# Patient Record
Sex: Male | Born: 1975 | Race: Black or African American | Hispanic: No | Marital: Married
Health system: Southern US, Community
[De-identification: ages and names within clinical notes are randomized; demographics above are authoritative.]

---

## 2004-05-22 ENCOUNTER — Encounter: Admission: RE | Admit: 2004-05-22 | Discharge: 2004-05-22 | Payer: Self-pay | Admitting: Family Medicine

## 2005-12-07 IMAGING — CR DG ANKLE COMPLETE 3+V*L*
3 series · 3 of 3 positions shown · non-contrast
Comparison: none

CLINICAL DATA: 28 year old with basketball injury.  Twisted ankle.
 LEFT ANKLE COMPLETE:
 The ankle mortise is maintained.  There is lateral soft tissue swelling, but no fracture is seen.

[view not recorded (1 of 3)]
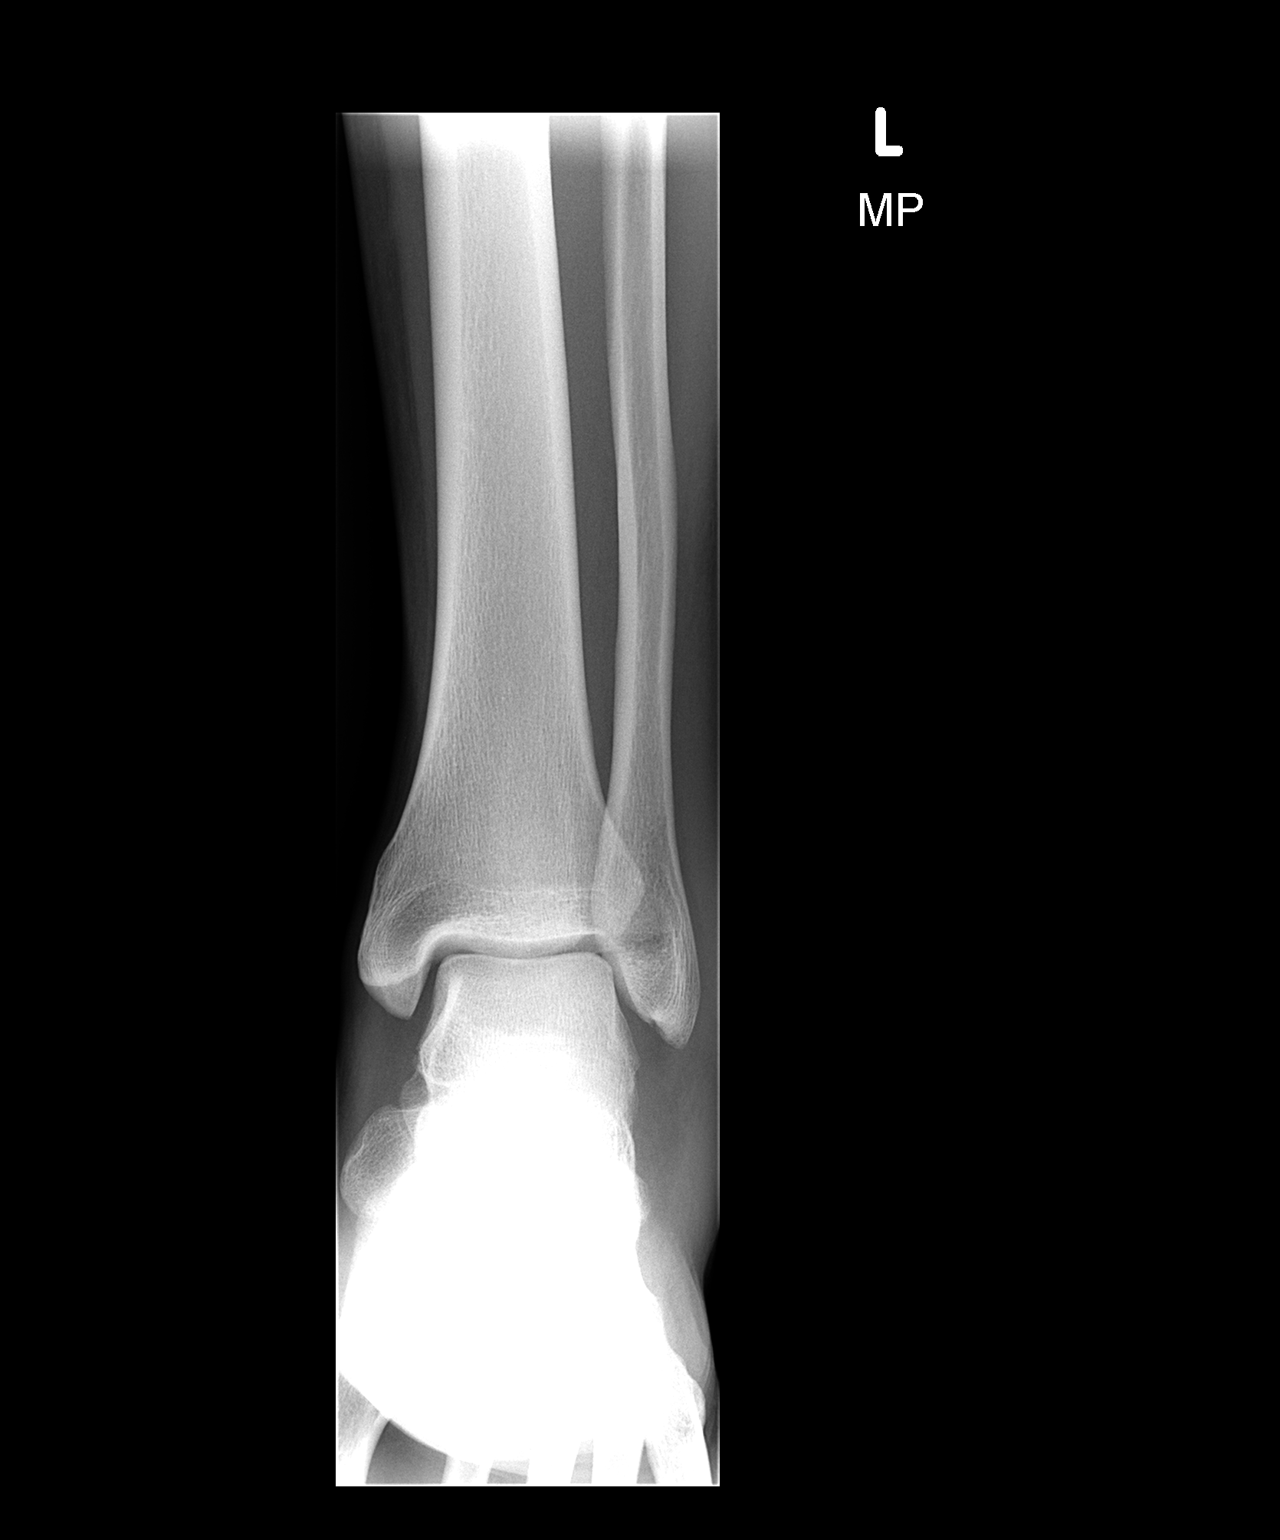

[view not recorded (2 of 3)]
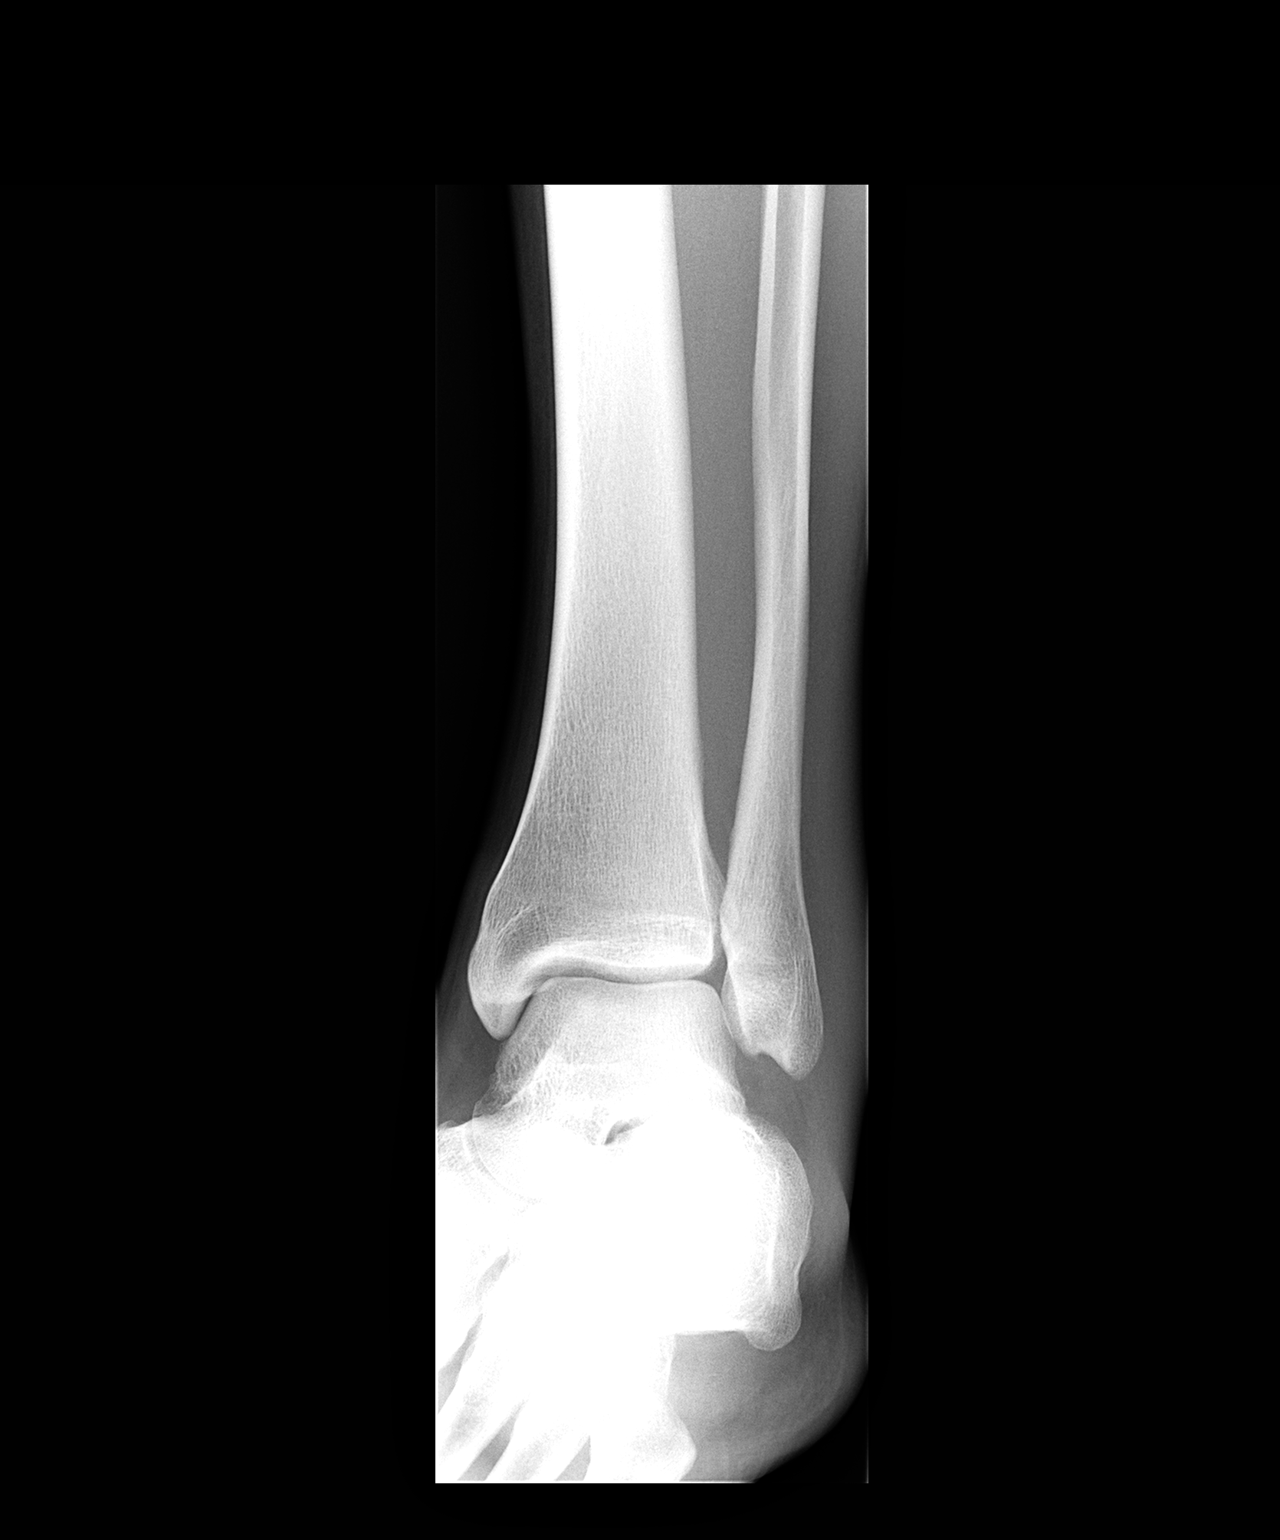

[view not recorded (3 of 3)]
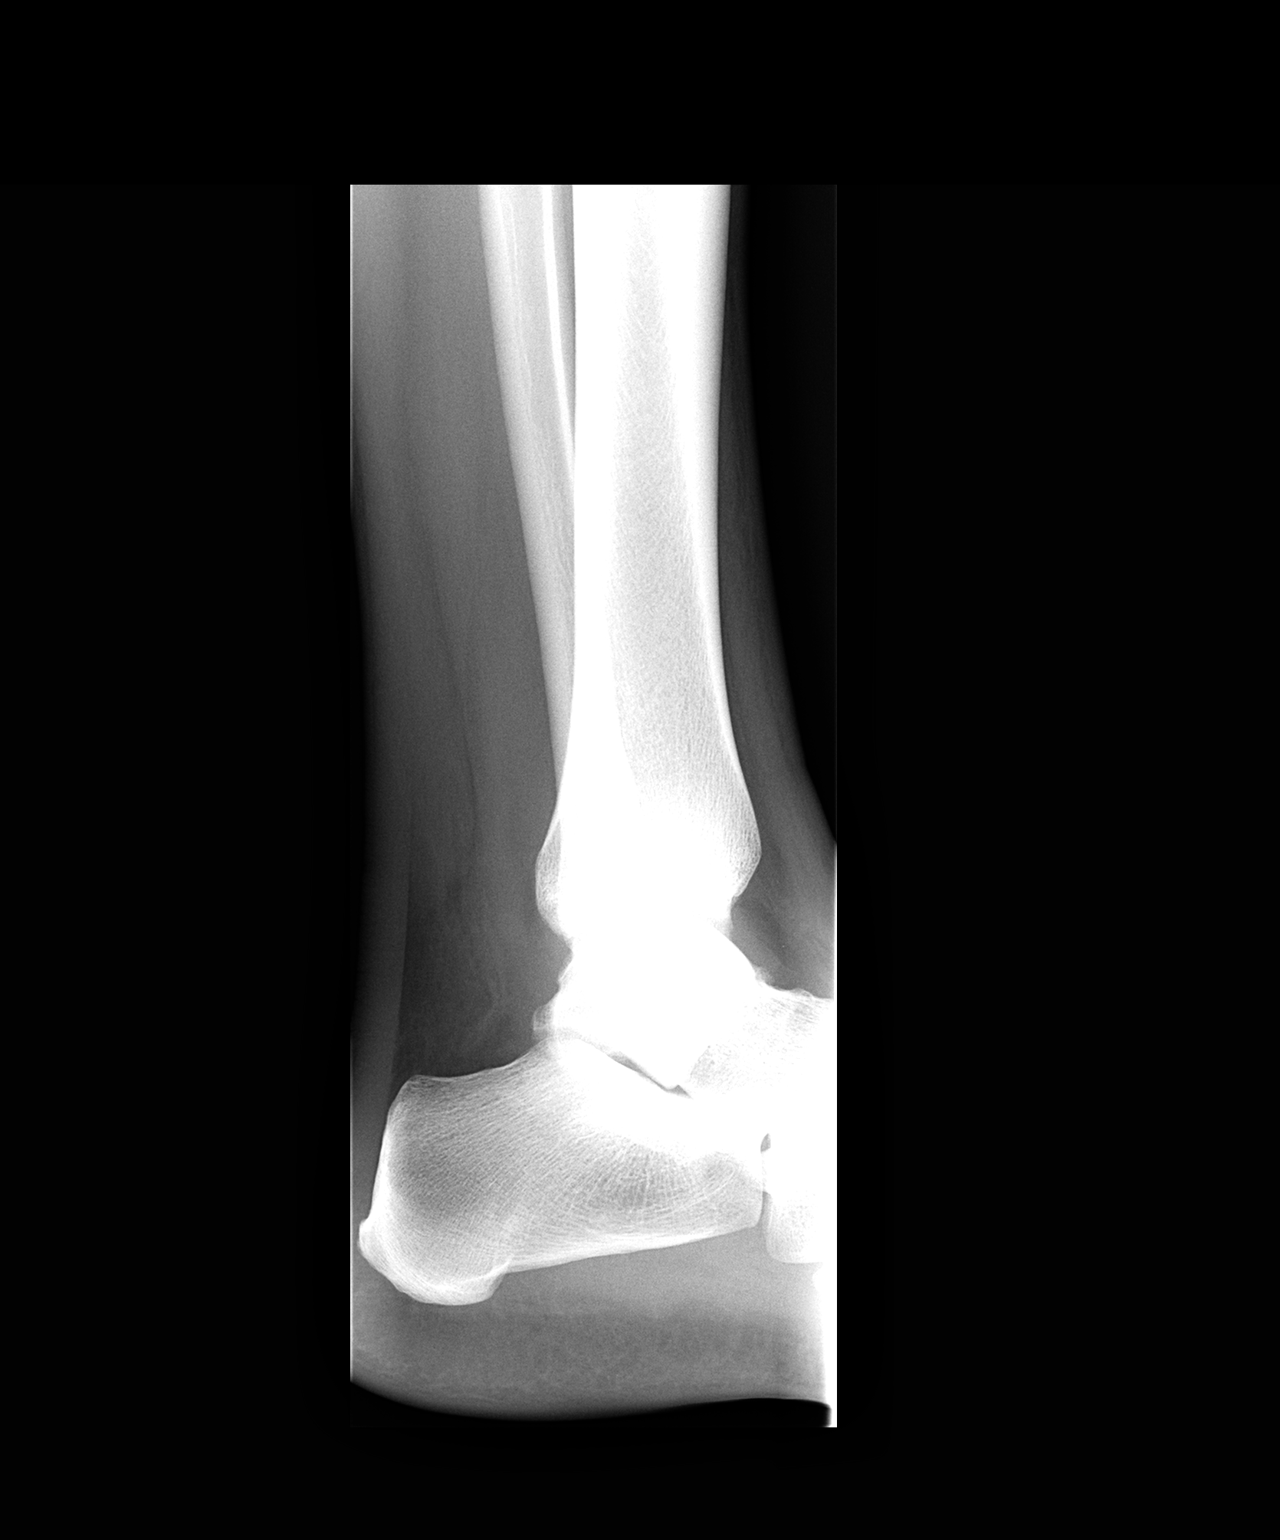

[3 of 3 positions shown; findings below may reference images not displayed]

IMPRESSION: Lateral soft tissue swelling, but no definite fracture.

## 2016-10-11 DIAGNOSIS — N400 Enlarged prostate without lower urinary tract symptoms: Secondary | ICD-10-CM | POA: Diagnosis not present

## 2016-11-23 DIAGNOSIS — R1031 Right lower quadrant pain: Secondary | ICD-10-CM | POA: Diagnosis not present

## 2016-11-30 DIAGNOSIS — Z0000 Encounter for general adult medical examination without abnormal findings: Secondary | ICD-10-CM | POA: Diagnosis not present

## 2016-11-30 DIAGNOSIS — Z13220 Encounter for screening for lipoid disorders: Secondary | ICD-10-CM | POA: Diagnosis not present

## 2017-07-30 ENCOUNTER — Other Ambulatory Visit: Payer: Self-pay | Admitting: Family Medicine

## 2017-07-30 DIAGNOSIS — N644 Mastodynia: Secondary | ICD-10-CM

## 2017-08-02 ENCOUNTER — Ambulatory Visit
Admission: RE | Admit: 2017-08-02 | Discharge: 2017-08-02 | Disposition: A | Discharge status: Home / Self Care | Payer: 59 | Source: Ambulatory Visit | Attending: Family Medicine | Admitting: Family Medicine

## 2017-08-02 ENCOUNTER — Ambulatory Visit: Payer: Self-pay

## 2017-08-02 DIAGNOSIS — R928 Other abnormal and inconclusive findings on diagnostic imaging of breast: Secondary | ICD-10-CM | POA: Diagnosis not present

## 2017-08-02 DIAGNOSIS — N644 Mastodynia: Secondary | ICD-10-CM

## 2018-01-15 DIAGNOSIS — Z13220 Encounter for screening for lipoid disorders: Secondary | ICD-10-CM | POA: Diagnosis not present

## 2018-01-15 DIAGNOSIS — Z0000 Encounter for general adult medical examination without abnormal findings: Secondary | ICD-10-CM | POA: Diagnosis not present

## 2019-06-03 ENCOUNTER — Ambulatory Visit: Payer: 59 | Attending: Internal Medicine

## 2019-06-03 DIAGNOSIS — Z20822 Contact with and (suspected) exposure to covid-19: Secondary | ICD-10-CM

## 2019-06-04 ENCOUNTER — Ambulatory Visit: Payer: Self-pay

## 2019-06-04 LAB — NOVEL CORONAVIRUS, NAA: SARS-CoV-2, NAA: NOT DETECTED

## 2019-06-04 NOTE — Telephone Encounter (Signed)
Covid results are not available yet.

## 2019-06-19 ENCOUNTER — Ambulatory Visit: Payer: 59 | Attending: Internal Medicine

## 2019-06-19 DIAGNOSIS — Z20822 Contact with and (suspected) exposure to covid-19: Secondary | ICD-10-CM

## 2019-06-20 LAB — NOVEL CORONAVIRUS, NAA: SARS-CoV-2, NAA: NOT DETECTED

## 2021-02-13 DIAGNOSIS — Z0000 Encounter for general adult medical examination without abnormal findings: Secondary | ICD-10-CM | POA: Diagnosis not present

## 2021-02-13 DIAGNOSIS — Z13220 Encounter for screening for lipoid disorders: Secondary | ICD-10-CM | POA: Diagnosis not present

## 2021-02-13 DIAGNOSIS — Z131 Encounter for screening for diabetes mellitus: Secondary | ICD-10-CM | POA: Diagnosis not present

## 2021-02-23 DIAGNOSIS — M25511 Pain in right shoulder: Secondary | ICD-10-CM | POA: Diagnosis not present

## 2021-03-24 ENCOUNTER — Ambulatory Visit
Admission: RE | Admit: 2021-03-24 | Discharge: 2021-03-24 | Disposition: A | Discharge status: Home / Self Care | Payer: BC Managed Care – PPO | Source: Ambulatory Visit | Attending: Sports Medicine | Admitting: Sports Medicine

## 2021-03-24 ENCOUNTER — Other Ambulatory Visit: Payer: Self-pay | Admitting: Sports Medicine

## 2021-03-24 DIAGNOSIS — M25511 Pain in right shoulder: Secondary | ICD-10-CM

## 2021-03-29 DIAGNOSIS — M25511 Pain in right shoulder: Secondary | ICD-10-CM | POA: Diagnosis not present

## 2021-08-21 DIAGNOSIS — R059 Cough, unspecified: Secondary | ICD-10-CM | POA: Diagnosis not present

## 2022-02-14 DIAGNOSIS — Z0000 Encounter for general adult medical examination without abnormal findings: Secondary | ICD-10-CM | POA: Diagnosis not present

## 2022-02-14 DIAGNOSIS — Z131 Encounter for screening for diabetes mellitus: Secondary | ICD-10-CM | POA: Diagnosis not present

## 2022-02-14 DIAGNOSIS — E7800 Pure hypercholesterolemia, unspecified: Secondary | ICD-10-CM | POA: Diagnosis not present

## 2022-02-14 DIAGNOSIS — Z23 Encounter for immunization: Secondary | ICD-10-CM | POA: Diagnosis not present

## 2022-10-09 IMAGING — CR DG SHOULDER 2+V*R*
3 series · 3 of 3 positions shown · non-contrast
Comparison: None.

CLINICAL DATA: Right shoulder pain

EXAM:
RIGHT SHOULDER - 2+ VIEW

[w shoulder ap internal righ]
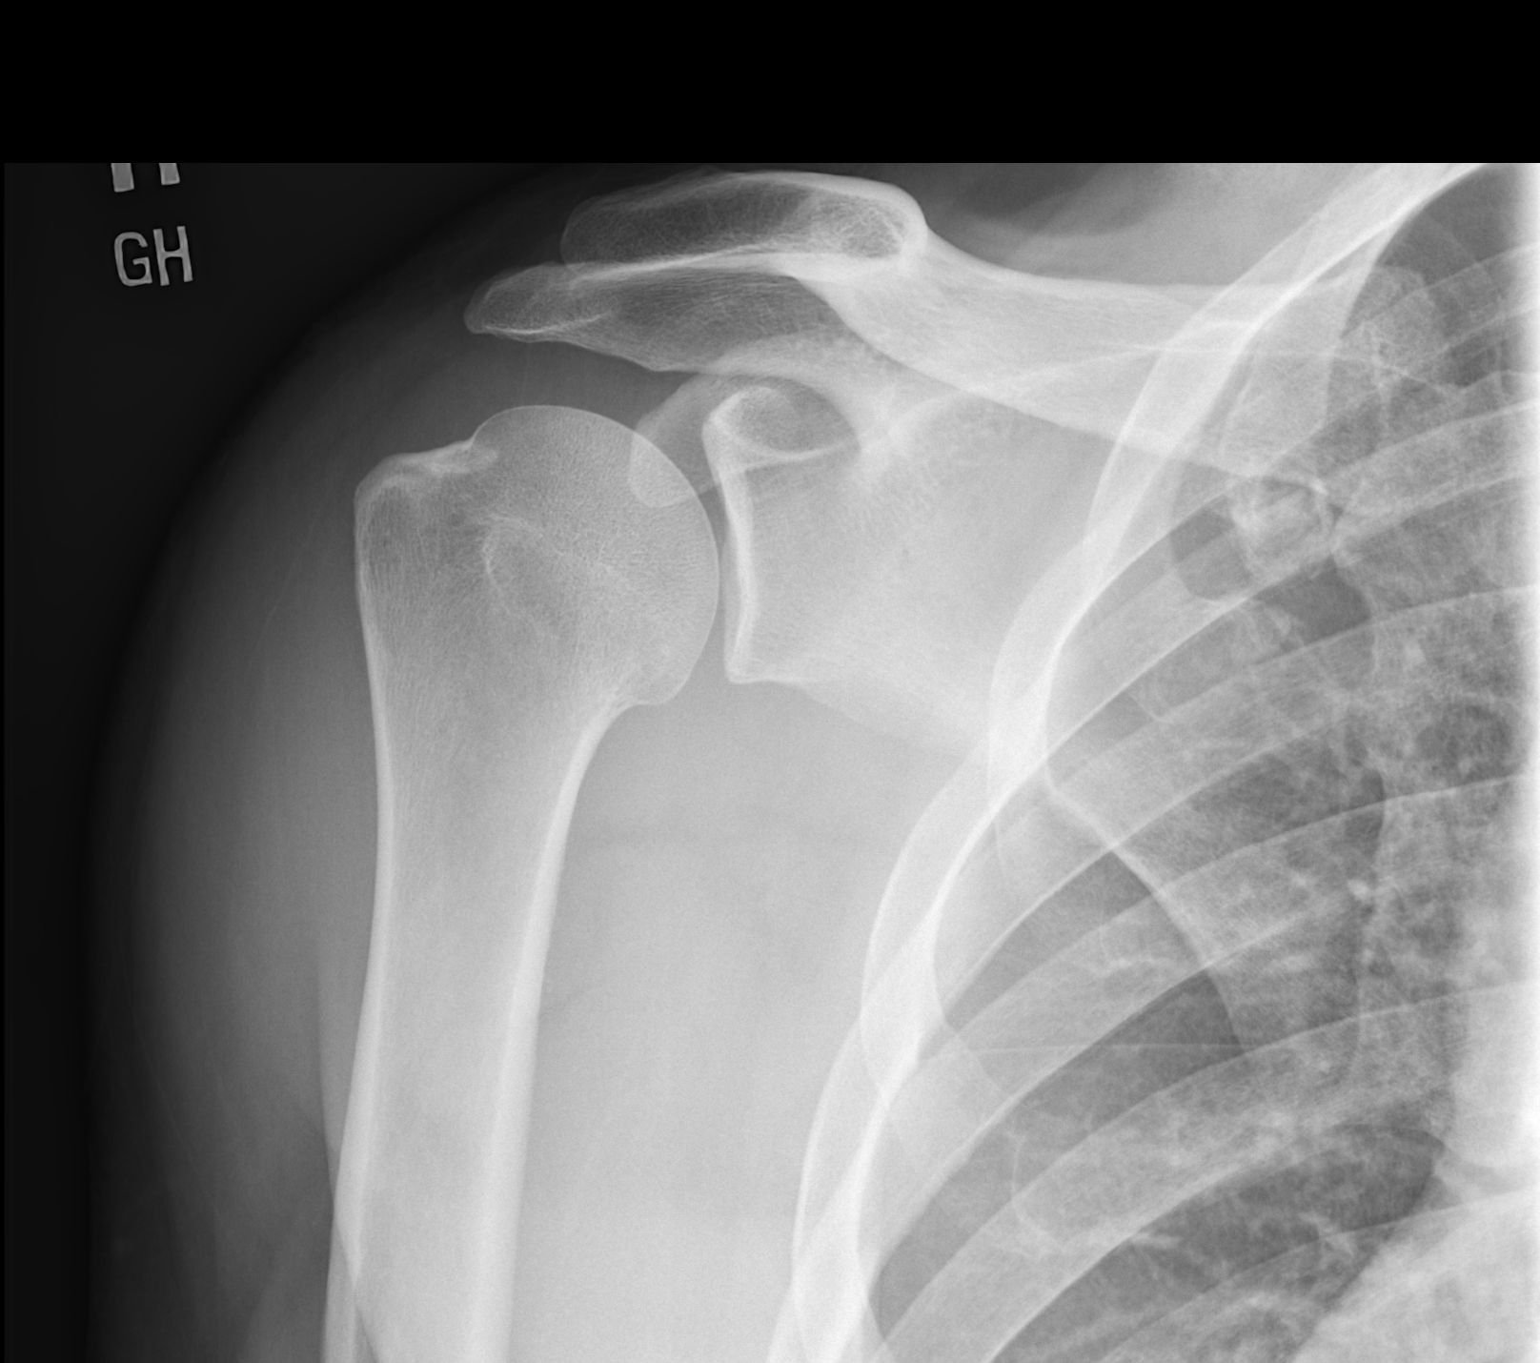

[w shoulder y view right]
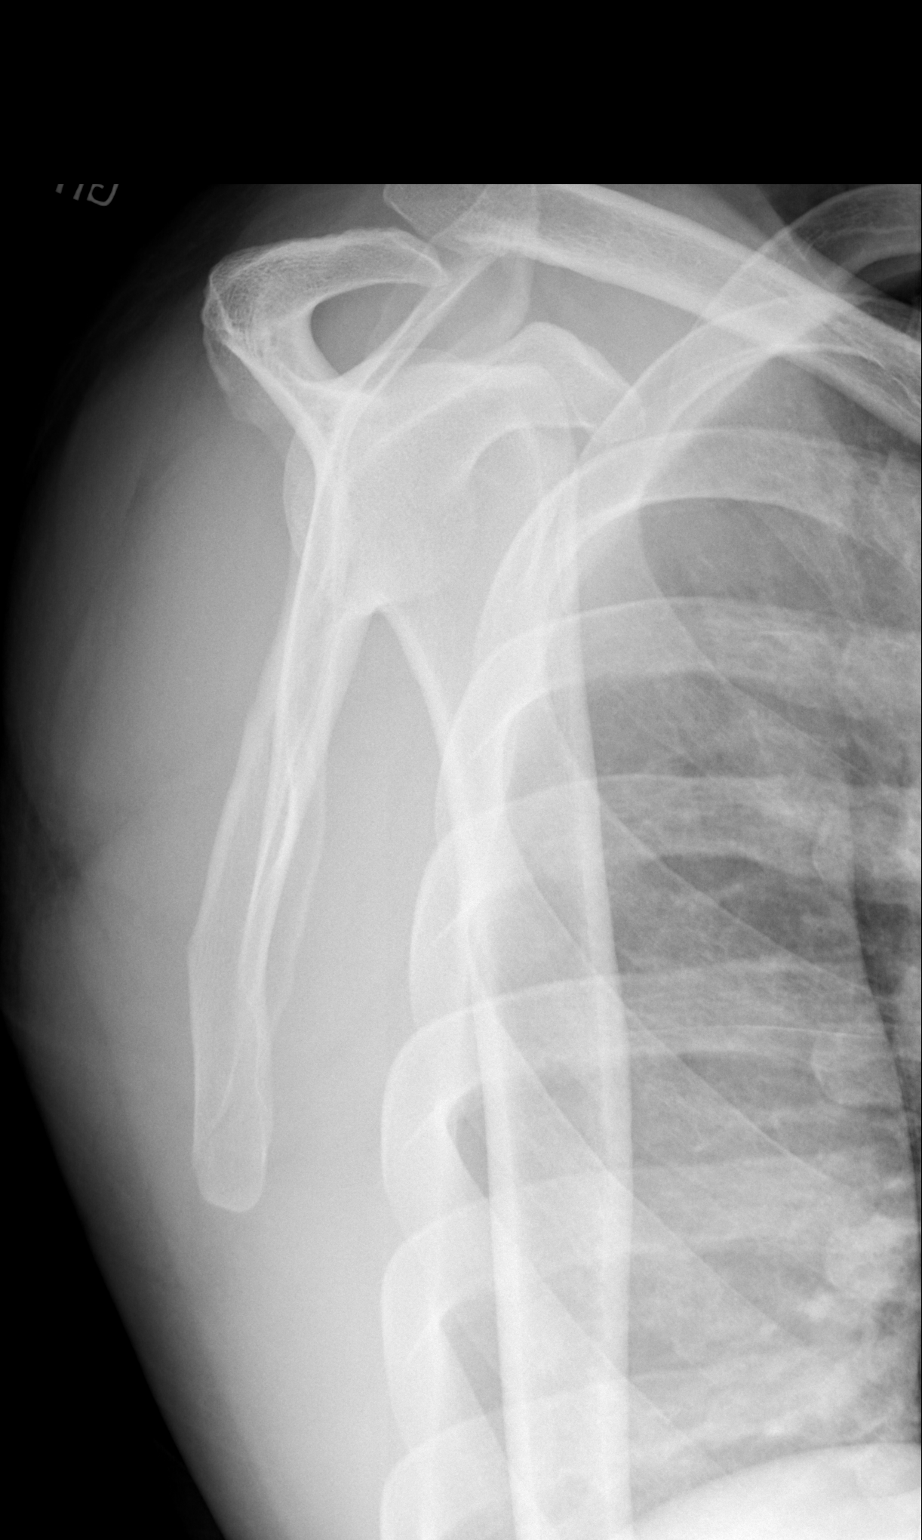

[w shoulder axillary right *]
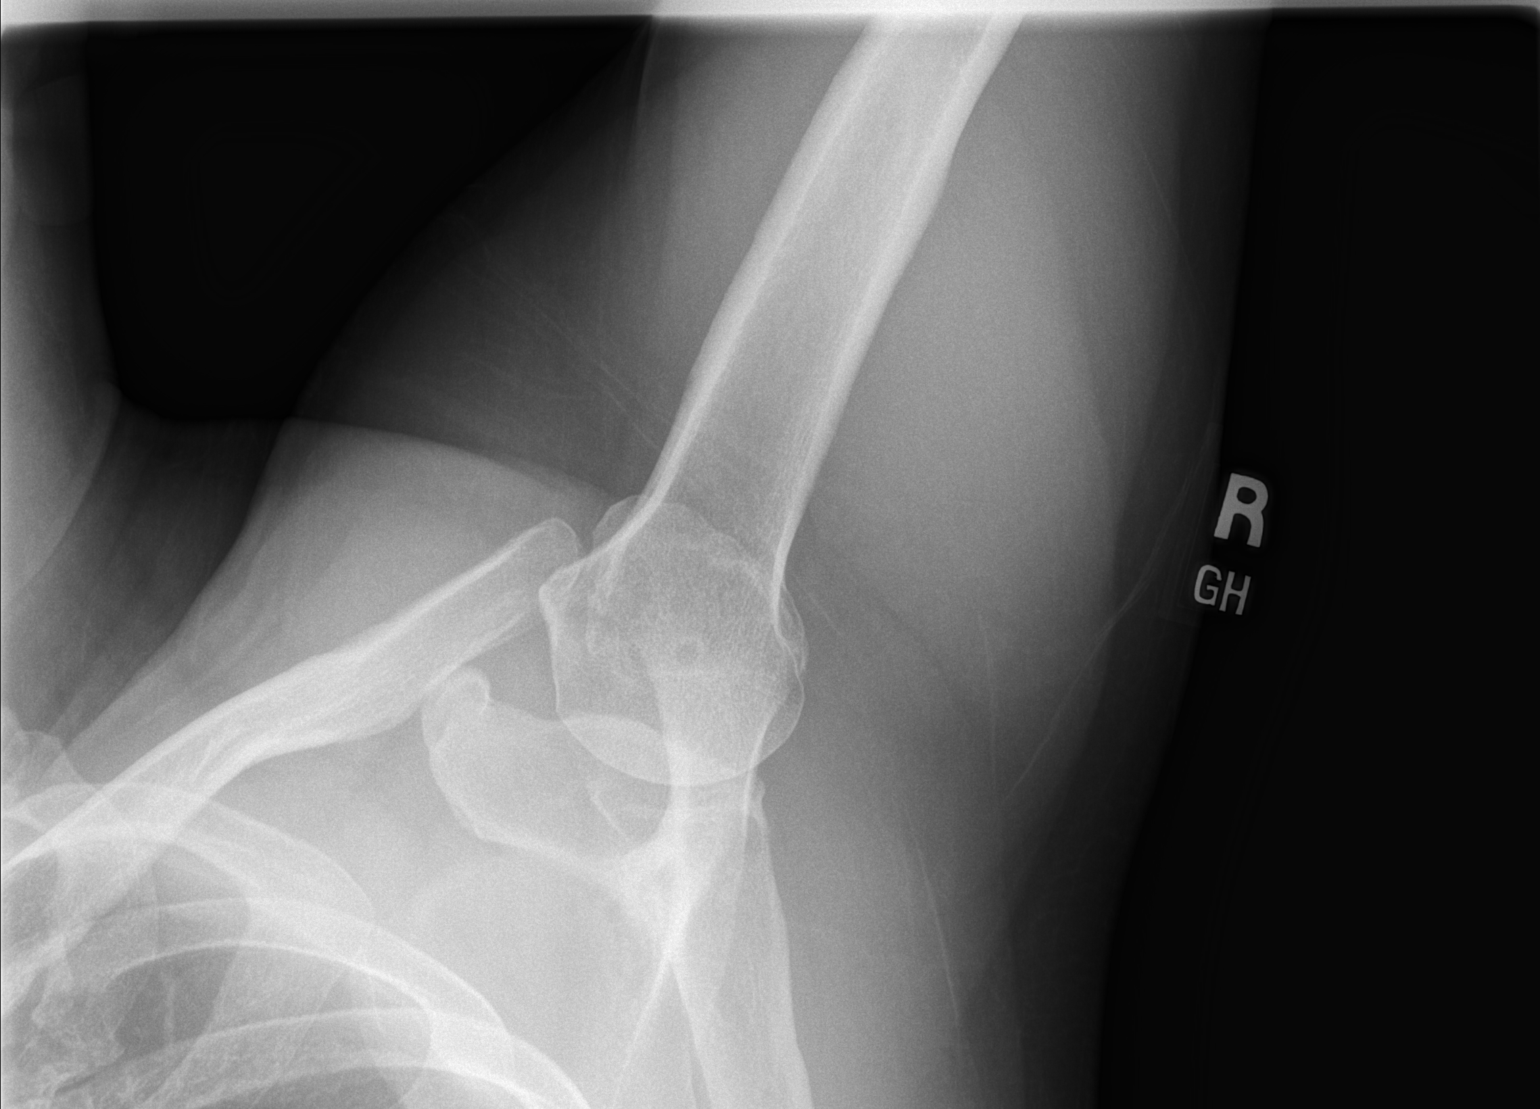

[3 of 3 positions shown; findings below may reference images not displayed]

FINDINGS: There is no evidence of fracture or dislocation. There is no
evidence of arthropathy or other focal bone abnormality. Soft
tissues are unremarkable.
IMPRESSION: Negative.

## 2022-12-28 DIAGNOSIS — R059 Cough, unspecified: Secondary | ICD-10-CM | POA: Diagnosis not present

## 2023-02-18 DIAGNOSIS — E669 Obesity, unspecified: Secondary | ICD-10-CM | POA: Diagnosis not present

## 2023-02-18 DIAGNOSIS — Z1159 Encounter for screening for other viral diseases: Secondary | ICD-10-CM | POA: Diagnosis not present

## 2023-02-18 DIAGNOSIS — Z125 Encounter for screening for malignant neoplasm of prostate: Secondary | ICD-10-CM | POA: Diagnosis not present

## 2023-02-18 DIAGNOSIS — Z0000 Encounter for general adult medical examination without abnormal findings: Secondary | ICD-10-CM | POA: Diagnosis not present

## 2023-02-18 DIAGNOSIS — G4730 Sleep apnea, unspecified: Secondary | ICD-10-CM | POA: Diagnosis not present

## 2023-02-28 DIAGNOSIS — R0683 Snoring: Secondary | ICD-10-CM | POA: Diagnosis not present

## 2023-03-17 DIAGNOSIS — Z1211 Encounter for screening for malignant neoplasm of colon: Secondary | ICD-10-CM | POA: Diagnosis not present

## 2023-04-02 DIAGNOSIS — G4733 Obstructive sleep apnea (adult) (pediatric): Secondary | ICD-10-CM | POA: Diagnosis not present

## 2023-04-08 DIAGNOSIS — G4733 Obstructive sleep apnea (adult) (pediatric): Secondary | ICD-10-CM | POA: Diagnosis not present

## 2023-05-23 DIAGNOSIS — R7401 Elevation of levels of liver transaminase levels: Secondary | ICD-10-CM | POA: Diagnosis not present

## 2023-05-23 DIAGNOSIS — R7309 Other abnormal glucose: Secondary | ICD-10-CM | POA: Diagnosis not present

## 2023-08-28 DIAGNOSIS — E119 Type 2 diabetes mellitus without complications: Secondary | ICD-10-CM | POA: Diagnosis not present
